# Patient Record
Sex: Male | Born: 2005
Health system: Southern US, Community
[De-identification: ages and names within clinical notes are randomized; demographics above are authoritative.]

## PROBLEM LIST (undated history)

## (undated) ENCOUNTER — Ambulatory Visit

## (undated) DIAGNOSIS — J45909 Unspecified asthma, uncomplicated: Secondary | ICD-10-CM

## (undated) DIAGNOSIS — T7840XA Allergy, unspecified, initial encounter: Secondary | ICD-10-CM

## (undated) HISTORY — PX: MYRINGOTOMY: SUR874

## (undated) HISTORY — DX: Unspecified asthma, uncomplicated: J45.909

## (undated) HISTORY — DX: Allergy, unspecified, initial encounter: T78.40XA

---
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
## 2023-04-04 ENCOUNTER — Other Ambulatory Visit (HOSPITAL_BASED_OUTPATIENT_CLINIC_OR_DEPARTMENT_OTHER): Payer: Self-pay

## 2023-04-13 ENCOUNTER — Other Ambulatory Visit (HOSPITAL_BASED_OUTPATIENT_CLINIC_OR_DEPARTMENT_OTHER): Payer: Self-pay | Admitting: Nurse Practitioner

## 2023-04-13 ENCOUNTER — Other Ambulatory Visit (HOSPITAL_COMMUNITY): Payer: Self-pay

## 2023-04-13 DIAGNOSIS — Z0000 Encounter for general adult medical examination without abnormal findings: Secondary | ICD-10-CM

## 2023-04-13 DIAGNOSIS — F411 Generalized anxiety disorder: Secondary | ICD-10-CM

## 2023-04-13 DIAGNOSIS — F321 Major depressive disorder, single episode, moderate: Secondary | ICD-10-CM

## 2023-04-13 MED ORDER — SERTRALINE HCL 50 MG PO TABS
50.0000 mg | ORAL_TABLET | Freq: Every day | ORAL | 3 refills | Status: DC
  Filled 2023-04-13: qty 90, 90d supply, fill #0

## 2023-04-13 NOTE — Telephone Encounter (Signed)
 Last apt 11/07/21 pt. Sent MyChart message that he needs to schedule an appt.

## 2023-04-23 ENCOUNTER — Other Ambulatory Visit (HOSPITAL_COMMUNITY): Payer: Self-pay

## 2023-05-27 ENCOUNTER — Ambulatory Visit
Admission: EM | Admit: 2023-05-27 | Discharge: 2023-05-27 | Disposition: A | Discharge status: Home / Self Care | Attending: Nurse Practitioner | Admitting: Nurse Practitioner

## 2023-05-27 DIAGNOSIS — Z8709 Personal history of other diseases of the respiratory system: Secondary | ICD-10-CM | POA: Diagnosis not present

## 2023-05-27 DIAGNOSIS — J069 Acute upper respiratory infection, unspecified: Secondary | ICD-10-CM

## 2023-05-27 MED ORDER — ALBUTEROL SULFATE (2.5 MG/3ML) 0.083% IN NEBU: 2.5000 mg | INHALATION_SOLUTION | Freq: Four times a day (QID) | RESPIRATORY_TRACT | 0 refills | Status: DC | PRN

## 2023-05-27 MED ORDER — PREDNISONE 20 MG PO TABS: 40.0000 mg | ORAL_TABLET | Freq: Every day | ORAL | 0 refills | Status: AC

## 2023-05-27 NOTE — ED Provider Notes (Signed)
 [facility]    CSN: [id number] Arrival date & time: 05/27/23  1224      History   Chief Complaint No chief complaint on file.   HPI Andre Diaz is a 18 y.o. male.   The history is provided by the patient.   Patient presents with a 5-day history of cough, chest congestion, wheezing, nasal congestion, and runny nose.  Patient denies fever, chills, headache, ear pain, difficulty breathing, chest pain, abdominal pain, nausea, vomiting, diarrhea, or rash.  Patient reports he has been taking over-the-counter cough and cold medications for his symptoms.  Also reports that he has been using his albuterol  inhaler and nebulizer with minimal relief.  Patient took 2 home COVID test, both were negative.  Patient with underlying history of seasonal allergies and asthma.  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   Reactive airway disease     Patient Active Problem List   Diagnosis Date Noted   Depression, major, single episode, moderate (HCC) 10/21/2021   GAD (generalized anxiety disorder) 10/21/2021   BMI, pediatric, 99th percentile or greater for age 42/16/2023   Allergy to honey bee venom 10/18/2020   Need for immunization against influenza 10/18/2020   Encounter for annual physical exam 10/30/2019   Migraine headache 07/26/2016   ADHD (attention deficit hyperactivity disorder) 08/06/2012    Past Surgical History:  Procedure Laterality Date   MYRINGOTOMY     bilateral   TYMPANOSTOMY TUBE PLACEMENT  2006       Home Medications    Prior to Admission medications   Medication Sig Start Date End Date Taking? Authorizing Provider  benzonatate  (TESSALON  PERLES) 100 MG capsule Take 1 capsule (100 mg total) by mouth 3 (three) times daily as  needed. 01/07/22   Early, Sara E, NP  cephALEXin  (KEFLEX ) 500 MG capsule Take 1 capsule (500 mg total) by mouth 2 (two) times daily. 12/06/22   Corbin Dess, PA-C  cetirizine  (ZYRTEC  ALLERGY) 10 MG tablet Take 1 tablet (10 mg total) by mouth at bedtime. 08/22/22 09/21/22  Mardene Shake, FNP  nystatin  (MYCOSTATIN /NYSTOP ) powder Apply 1 Application topically 2 (two) times daily as needed. 12/06/22   Corbin Dess, PA-C  nystatin  cream (MYCOSTATIN ) Apply to affected area 2 times daily prn 12/06/22   Corbin Dess, PA-C  predniSONE  (DELTASONE ) 10 MG tablet Take 4 tablets (40mg ) on days 1-4, then 3 tablets (30mg ) on days 5-8, then 2 tablets (20mg ) on days 9-11, then 1 tablet daily for days 12-14. Take with food. 08/22/22   Mardene Shake, FNP  sertraline  (ZOLOFT ) 50 MG tablet Take 1 tablet (50 mg total) by mouth at bedtime. 04/13/23   Early, Adriane Albe, NP    Family History Family History  Problem Relation Age of Onset   Diabetes Mother    Kidney disease Father        stones    Social History Social History   Tobacco Use   Smoking status: Never   Smokeless tobacco: Never  Vaping Use   Vaping status: Never Used  Substance Use Topics   Alcohol use: No   Drug use: No     Allergies   Bee venom   Review of Systems Review of Systems Per HPI  Physical Exam Triage Vital Signs ED Triage Vitals  Encounter Vitals Group     BP 05/27/23 1227 119/80     Systolic BP Percentile --      Diastolic BP Percentile --      Pulse Rate 05/27/23 1227 84     Resp 05/27/23 1227 20     Temp 05/27/23 1227 98.5 F (36.9 C)     Temp Source 05/27/23 1227 Oral     SpO2 05/27/23 1227 95 %     Weight --      Height --      Head Circumference --      Peak Flow --      Pain Score 05/27/23 1230 0     Pain Loc --      Pain Education --      Exclude from Growth Chart --    No data found.  Updated Vital Signs BP 119/80 (BP Location: Right Arm)   Pulse 84   Temp 98.5 F (36.9 C)  (Oral)   Resp 20   SpO2 95%   Visual Acuity Right Eye Distance:   Left Eye Distance:   Bilateral Distance:    Right Eye Near:   Left Eye Near:    Bilateral Near:     Physical Exam Vitals and nursing note reviewed.  Constitutional:      General: He is not in acute distress.    Appearance: Normal appearance.  HENT:     Head: Normocephalic.     Right Ear: Tympanic membrane, ear canal and external ear normal.     Left Ear: Tympanic membrane, ear canal and external ear normal.     Nose: Congestion present.     Mouth/Throat:     Mouth: Mucous membranes are moist.     Pharynx: No posterior oropharyngeal erythema.  Eyes:     Extraocular Movements: Extraocular movements intact.     Pupils: Pupils are equal, round, and reactive to light.  Cardiovascular:     Rate and Rhythm: Normal rate and regular rhythm.     Pulses: Normal pulses.     Heart sounds: Normal heart sounds.  Pulmonary:     Effort: Pulmonary effort is normal. No respiratory distress.     Breath sounds: Normal breath sounds. No stridor. No wheezing, rhonchi or rales.  Abdominal:     General: Bowel sounds are normal.     Palpations: Abdomen is soft.     Tenderness: There is no abdominal tenderness.  Musculoskeletal:     Cervical back: Normal range of motion.  Skin:    General: Skin is warm and dry.  Neurological:     General: No focal deficit present.     Mental Status: He is alert and oriented to person, place, and time.  Psychiatric:        Mood and Affect: Mood normal.        Behavior: Behavior normal.      UC Treatments / Results  Labs (all labs ordered are listed, but only abnormal results are displayed) Labs Reviewed - No data to display  EKG   Radiology No results found.  Procedures Procedures (including critical care time)  Medications Ordered in UC Medications - No data to display  Initial Impression / Assessment and Plan / UC Course  I have reviewed the triage vital signs and the  nursing notes.  Pertinent labs & imaging results that were available during my care of the patient were reviewed by me and considered  in my medical decision making (see chart for details).  On exam, lung sounds are clear throughout, room air sats at 95%.  Patient is well-appearing, and vital signs are stable.  Patient with underlying history of asthma, which is most likely being exacerbated by the viral URI.  Will provide symptomatic treatment with prednisone  40 mg for the next 5 days, and refill the patient's albuterol  nebulizer treatment.  Supportive care recommendations were provided and discussed with the patient to include fluids, rest, over-the-counter analgesics, and use of a humidifier during sleep.  Discussed indications with patient regarding follow-up.  Patient was in agreement with this plan of care and verbalizes understanding.  All questions were answered.  Patient stable for discharge. Final Clinical Impressions(s) / UC Diagnoses   Final diagnoses:  None   Discharge Instructions   None    ED Prescriptions   None    PDMP not reviewed this encounter.   Hardy Lia, NP 05/27/23 1314

## 2023-05-27 NOTE — ED Triage Notes (Addendum)
 Pt reports cough,congestion, wheezing, difficulty breathing, has tried Mucinex sx's x 5 days took 2 home covid test both negative

## 2023-05-27 NOTE — Discharge Instructions (Signed)
 Take medication as prescribed. Increase fluids allow for plenty of rest. You may take over-the-counter Tylenol or ibuprofen  as needed for pain, fever, or general discomfort. Recommend the use of a humidifier in your bedroom at nighttime during sleep and sleeping elevated on pillows while cough symptoms persist. Symptoms should improve over the next 5 to 7 days.  If symptoms fail to improve, or appear to be worsening, you may follow-up in this clinic or with your primary care physician for further evaluation. Follow-up as needed.

## 2023-08-30 ENCOUNTER — Ambulatory Visit
Admission: EM | Admit: 2023-08-30 | Discharge: 2023-08-30 | Disposition: A | Discharge status: Home / Self Care | Attending: Family Medicine | Admitting: Family Medicine

## 2023-08-30 DIAGNOSIS — R21 Rash and other nonspecific skin eruption: Secondary | ICD-10-CM | POA: Diagnosis not present

## 2023-08-30 DIAGNOSIS — T63441A Toxic effect of venom of bees, accidental (unintentional), initial encounter: Secondary | ICD-10-CM

## 2023-08-30 DIAGNOSIS — L03114 Cellulitis of left upper limb: Secondary | ICD-10-CM | POA: Diagnosis not present

## 2023-08-30 MED ORDER — MUPIROCIN 2 % EX OINT: 1.0000 | TOPICAL_OINTMENT | Freq: Two times a day (BID) | CUTANEOUS | 0 refills | Status: DC

## 2023-08-30 MED ORDER — BACITRACIN 500 UNIT/GM EX OINT
1.0000 | TOPICAL_OINTMENT | Freq: Two times a day (BID) | CUTANEOUS | Status: DC
  Administered 2023-08-30: 1 via TOPICAL

## 2023-08-30 MED ORDER — METHYLPREDNISOLONE ACETATE 80 MG/ML IJ SUSP
80.0000 mg | Freq: Once | INTRAMUSCULAR | Status: AC
  Administered 2023-08-30: 80 mg via INTRAMUSCULAR

## 2023-08-30 MED ORDER — CEPHALEXIN 500 MG PO CAPS: 500.0000 mg | ORAL_CAPSULE | Freq: Two times a day (BID) | ORAL | 0 refills | Status: DC

## 2023-08-30 MED ORDER — EPINEPHRINE 0.3 MG/0.3ML IJ SOAJ
0.3000 mg | INTRAMUSCULAR | 0 refills | Status: AC | PRN
Start: 2023-08-30 — End: ?

## 2023-08-30 NOTE — ED Notes (Signed)
 Left upper extremity cleaned with hibiclense and sterile water. Pt tolerated well.  Bacitracin  ointment applied to site. Nonadherent pads placed to upper arm and AC and dressing secured with coban. Two smaller areas of redness noted to left forearm. Pt/pt family requested to have cleansed,ointment and bandaid applied to site.pt tolerated well.  Site management and infection prevention reviewed. Pt and pt family verbalized understanding.

## 2023-08-30 NOTE — ED Triage Notes (Addendum)
 Pt reports rash that appeared on his arm after being stung happened 2 days ago hives continue to appear. Started on the left arm where pt was stung and has spread

## 2023-08-30 NOTE — Discharge Instructions (Addendum)
 We have given you a steroid shot today to help resolve the rash and lingering allergic reaction.  You may also take a Zyrtec  and Pepcid once to twice daily to help with the allergic reaction.  I have prescribed an antibiotic to help with the developing cellulitis to your left arm and you may clean the area with soap and water at least once a day and apply the mupirocin  ointment.  Keep it covered with a nonstick dressing.  Follow-up for worsening or unresolving symptoms. As requested, I have prescribed an EpiPen .  If you ever do have to use this, it is imperative that you directly get yourself to the emergency department by either calling 911 or having somebody drive you immediately there.  You must be monitored after taking this medication

## 2023-09-02 NOTE — ED Provider Notes (Signed)
 [facility]    CSN: [id number] Arrival date & time: 08/30/23  1348      History   Chief Complaint No chief complaint on file.   HPI Andre Diaz is a 18 y.o. male.   Patient presenting today with 2-day history of a sting to the left arm and now diffuse redness, swelling, itching, irritation.  He is now having yellow crusting and worsening redness and inflammation to the anterior left elbow region.  Denies fever, chills, chest tightness, shortness of breath, throat itching or swelling though states he did have a bit of that at the initial time of the sting.  No past history of anaphylaxis but does have a past history of moderate allergy to bee stings.  Took 2 Benadryl daily since incident which he states has been giving some mild relief.    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   Reactive airway disease     Patient Active Problem List   Diagnosis Date Noted   Depression, major, single episode, moderate (HCC) 10/21/2021   GAD (generalized anxiety disorder) 10/21/2021   BMI, pediatric, 99th percentile or greater for age 58/16/2023   Allergy to honey bee venom 10/18/2020   Need for immunization against influenza 10/18/2020   Encounter for annual physical exam 10/30/2019   Migraine headache 07/26/2016   ADHD (attention deficit hyperactivity disorder) 08/06/2012    Past Surgical History:  Procedure Laterality Date   MYRINGOTOMY     bilateral   TYMPANOSTOMY TUBE PLACEMENT  2006       Home Medications    Prior to Admission medications   Medication Sig Start Date End Date Taking? Authorizing Provider  cephALEXin  (KEFLEX ) 500 MG capsule Take 1 capsule (500 mg total) by mouth 2 (two) times daily. 08/30/23  Yes Stuart Vernell Norris,  PA-C  EPINEPHrine  0.3 mg/0.3 mL IJ SOAJ injection Inject 0.3 mg into the muscle as needed for anaphylaxis. 08/30/23  Yes Stuart Vernell Norris, PA-C  mupirocin  ointment (BACTROBAN ) 2 % Apply 1 Application topically 2 (two) times daily. 08/30/23  Yes Stuart Vernell Norris, PA-C  albuterol  (PROVENTIL ) (2.5 MG/3ML) 0.083% nebulizer solution Take 3 mLs (2.5 mg total) by nebulization every 6 (six) hours as needed for wheezing or shortness of breath. 05/27/23   Leath-Warren, Etta PARAS, NP  benzonatate  (TESSALON  PERLES) 100 MG capsule Take 1 capsule (100 mg total) by mouth 3 (three) times daily as needed. 01/07/22   Early, Sara E, NP  cephALEXin  (KEFLEX ) 500 MG capsule Take 1 capsule (500 mg total) by mouth 2 (two) times daily. 12/06/22   Stuart Vernell Norris, PA-C  cetirizine  (ZYRTEC  ALLERGY) 10 MG tablet Take 1 tablet (10 mg total) by mouth at bedtime. 08/22/22 09/21/22  Kennyth Domino, FNP  nystatin  (MYCOSTATIN Velma ) powder Apply 1 Application topically 2 (two) times daily as needed. 12/06/22   Stuart Vernell Norris, PA-C  nystatin  cream (MYCOSTATIN ) Apply to affected area 2 times daily prn 12/06/22   Stuart Vernell Norris, PA-C  sertraline  (ZOLOFT ) 50 MG tablet Take 1 tablet (50 mg total) by mouth at bedtime. 04/13/23   EarlyCamie BRAVO, NP    Family History Family History  Problem Relation Age of Onset   Diabetes Mother    Kidney disease Father  stones    Social History Social History   Tobacco Use   Smoking status: Never   Smokeless tobacco: Never  Vaping Use   Vaping status: Never Used  Substance Use Topics   Alcohol use: No   Drug use: No     Allergies   Bee venom   Review of Systems Review of Systems Per HPI  Physical Exam Triage Vital Signs ED Triage Vitals  Encounter Vitals Group     BP 08/30/23 1454 114/73     Girls Systolic BP Percentile --      Girls Diastolic BP Percentile --      Boys Systolic BP Percentile --      Boys Diastolic BP Percentile --       Pulse Rate 08/30/23 1454 65     Resp 08/30/23 1454 20     Temp 08/30/23 1454 98.3 F (36.8 C)     Temp Source 08/30/23 1454 Oral     SpO2 08/30/23 1454 97 %     Weight --      Height --      Head Circumference --      Peak Flow --      Pain Score 08/30/23 1455 9     Pain Loc --      Pain Education --      Exclude from Growth Chart --    No data found.  Updated Vital Signs BP 114/73 (BP Location: Right Arm)   Pulse 65   Temp 98.3 F (36.8 C) (Oral)   Resp 20   SpO2 97%   Visual Acuity Right Eye Distance:   Left Eye Distance:   Bilateral Distance:    Right Eye Near:   Left Eye Near:    Bilateral Near:     Physical Exam Vitals and nursing note reviewed.  Constitutional:      Appearance: Normal appearance.  HENT:     Head: Atraumatic.     Mouth/Throat:     Mouth: Mucous membranes are moist.     Pharynx: Oropharynx is clear. No posterior oropharyngeal erythema.  Eyes:     Extraocular Movements: Extraocular movements intact.     Conjunctiva/sclera: Conjunctivae normal.  Cardiovascular:     Rate and Rhythm: Normal rate and regular rhythm.  Pulmonary:     Effort: Pulmonary effort is normal.     Breath sounds: Normal breath sounds. No wheezing or rales.  Musculoskeletal:        General: Normal range of motion.     Cervical back: Normal range of motion and neck supple.  Skin:    General: Skin is warm.     Comments: Multiple medicine edematous patches to the left upper extremity, with the left AC region being the most prominent.  Yellow honey crust diffusely across this area  Neurological:     General: No focal deficit present.     Mental Status: He is oriented to person, place, and time.  Psychiatric:        Mood and Affect: Mood normal.        Thought Content: Thought content normal.        Judgment: Judgment normal.      UC Treatments / Results  Labs (all labs ordered are listed, but only abnormal results are displayed) Labs Reviewed - No data to  display  EKG   Radiology No results found.  Procedures Procedures (including critical care time)  Medications Ordered in UC Medications  methylPREDNISolone  acetate (DEPO-MEDROL ) injection 80  mg (80 mg Intramuscular Given 08/30/23 1557)    Initial Impression / Assessment and Plan / UC Course  I have reviewed the triage vital signs and the nursing notes.  Pertinent labs & imaging results that were available during my care of the patient were reviewed by me and considered in my medical decision making (see chart for details).     Does sound like he initially had some generalized reaction that was moderately improved after Benadryl so he did not seek further care at that time.  Will treat with Depo-Medrol  for allergic reaction and itch, rash as well as start Keflex  and mupirocin  for some secondary impetigo.  He is requesting an EpiPen  to keep at home, discussed appropriate use and to go to the emergency department if ever having to use it.  Zyrtec , Pepcid twice daily until reaction fully resolves.  Strict return precautions reviewed.  Final Clinical Impressions(s) / UC Diagnoses   Final diagnoses:  Rash  Bee sting, accidental or unintentional, initial encounter  Cellulitis of left upper extremity     Discharge Instructions      We have given you a steroid shot today to help resolve the rash and lingering allergic reaction.  You may also take a Zyrtec  and Pepcid once to twice daily to help with the allergic reaction.  I have prescribed an antibiotic to help with the developing cellulitis to your left arm and you may clean the area with soap and water at least once a day and apply the mupirocin  ointment.  Keep it covered with a nonstick dressing.  Follow-up for worsening or unresolving symptoms. As requested, I have prescribed an EpiPen .  If you ever do have to use this, it is imperative that you directly get yourself to the emergency department by either calling 911 or having somebody  drive you immediately there.  You must be monitored after taking this medication    ED Prescriptions     Medication Sig Dispense Auth. Provider   cephALEXin  (KEFLEX ) 500 MG capsule Take 1 capsule (500 mg total) by mouth 2 (two) times daily. 14 capsule Stuart Vernell Norris, PA-C   mupirocin  ointment (BACTROBAN ) 2 % Apply 1 Application topically 2 (two) times daily. 60 g Stuart Vernell [place], [place]   EPINEPHrine  0.3 mg/0.3 mL IJ SOAJ injection Inject 0.3 mg into the muscle as needed for anaphylaxis. 1 each Stuart Vernell Norris, PA-C      PDMP not reviewed this encounter.   Stuart Vernell Norris, [place] 09/02/23 1039

## 2023-11-04 ENCOUNTER — Other Ambulatory Visit: Payer: Self-pay

## 2023-11-04 NOTE — Progress Notes (Signed)
Pt cleared pre-employment UDS. 

## 2023-11-11 ENCOUNTER — Ambulatory Visit: Payer: Self-pay

## 2023-11-11 ENCOUNTER — Ambulatory Visit: Payer: Self-pay | Admitting: Physician Assistant

## 2023-11-11 ENCOUNTER — Encounter: Payer: Self-pay | Admitting: Physician Assistant

## 2023-11-11 VITALS — BP 133/90 | HR 107 | Temp 98.4°F | Resp 16 | Ht 71.0 in | Wt 270.0 lb

## 2023-11-11 DIAGNOSIS — Z021 Encounter for pre-employment examination: Secondary | ICD-10-CM

## 2023-11-11 NOTE — Progress Notes (Signed)
 PT CLEARED UDS AND COMPLETED ALL REQUIREMENTS FOR PRE-EMPLOYMENT PHYSICAL.

## 2023-11-11 NOTE — Progress Notes (Signed)
 [place] [employer] [place] [facility] ____________________________________________   None    (approximate)  I have reviewed the triage vital signs and the nursing notes.   HISTORY  Chief Complaint Employment Physical   HPI Andre Diaz is a 18 y.o. 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  Patient voiced no concerns or complaints.         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   Reactive airway disease     Patient Active Problem List   Diagnosis Date Noted   Depression, major, single episode, moderate (HCC) 10/21/2021   GAD (generalized anxiety disorder) 10/21/2021   BMI, pediatric, 99th percentile or greater for age 59/16/2023   Allergy to honey bee venom 10/18/2020   Need for immunization against influenza 10/18/2020   Encounter for annual physical exam 10/30/2019   Migraine headache 07/26/2016   ADHD (attention deficit hyperactivity disorder) 08/06/2012    Past Surgical History:  Procedure Laterality Date   MYRINGOTOMY     bilateral   TYMPANOSTOMY TUBE PLACEMENT  2006    Prior to Admission medications   Medication Sig Start Date End Date Taking? Authorizing Provider  EPINEPHrine  0.3 mg/0.3 mL IJ SOAJ injection Inject 0.3 mg into the muscle as needed for anaphylaxis. 08/30/23  Yes Stuart Vernell Norris, PA-C  cetirizine  (ZYRTEC  ALLERGY) 10 MG tablet Take 1 tablet (10 mg total) by mouth at bedtime. 08/22/22 09/21/22  Kennyth Domino, FNP    Allergies Bee venom  Family History  Problem Relation Age of Onset   Diabetes Mother    Kidney disease Father        stones    Social History Social History   Tobacco Use   Smoking status: Never   Smokeless tobacco: Never   Vaping Use   Vaping status: Never Used  Substance Use Topics   Alcohol use: No   Drug use: No    Review of Systems Constitutional: No fever/chills Eyes: No visual changes. ENT: No sore throat. Cardiovascular: Denies chest pain. Respiratory: Denies shortness of breath. Gastrointestinal: No abdominal pain.  No nausea, no vomiting.  No diarrhea.  No constipation. Genitourinary: Negative for dysuria. Musculoskeletal: Negative for back pain. Skin: Negative for rash. Neurological: Negative for headaches, focal weakness or numbness. Psychiatric: Anxiety and depression Allergic/Immunilogical: Bee venom ____________________________________________   PHYSICAL EXAM:  VITAL SIGNS:  11/11/2023   10:13 AM    BP 133/90  Cuff Size Large  Pulse Rate 107  Temp 98.4 F (36.9 C)  Temp Source Temporal  Weight 270 lb (122.5 kg)  Height 5' 11 (1.803 m)  Resp 16  SpO2 97 %   BMI: 37.66 kg/m2  BSA: 2.48 m2   Constitutional: Alert and oriented. Well appearing and in no acute distress. Eyes: Conjunctivae are normal. PERRL. EOMI. Head: Atraumatic. Nose: No congestion/rhinnorhea. Mouth/Throat: Mucous membranes are moist.  Oropharynx non-erythematous. Neck: No stridor. No cervical spine tenderness to palpation. Hematological/Lymphatic/Immunilogical: No cervical lymphadenopathy. Cardiovascular: Tachycardic, regular rhythm. Grossly normal heart sounds.  Good peripheral circulation. Respiratory: Normal respiratory effort.  No retractions. Lungs CTAB. Gastrointestinal: Soft and nontender. No distention. No abdominal bruits. No CVA tenderness. Genitourinary: Deferred Musculoskeletal: No lower extremity tenderness nor edema.  No joint effusions. Neurologic:  Normal speech and language. No gross focal neurologic deficits are appreciated. No gait instability. Skin:  Skin is warm, dry and intact. No rash noted. Psychiatric: Mood and affect are normal. Speech and behavior are  normal.  ____________________________________________   LABS _Pending ___________________________________________  EKG  Sinus rhythm 83 bpm ____________________________________________    ____________________________________________   INITIAL IMPRESSION / ASSESSMENT AND PLAN  As part of my medical decision making, I reviewed the following data within the electronic MEDICAL RECORD NUMBER       No acute findings on physical exam and EKG.  Labs pending.      ____________________________________________   FINAL CLINICAL IMPRESSION Well exam   ED Discharge Orders     None        Note:  This document was prepared using Dragon voice recognition software and may include unintentional dictation errors.

## 2023-11-12 LAB — CMP12+LP+TP+TSH+6AC+CBC/D/PLT
ALT: 42 IU/L (ref 0–44)
AST: 28 IU/L (ref 0–40)
Albumin: 4.8 g/dL (ref 4.3–5.2)
Alkaline Phosphatase: 89 IU/L (ref 51–125)
BUN/Creatinine Ratio: 15 (ref 9–20)
BUN: 13 mg/dL (ref 6–20)
Basophils Absolute: 0 x10E3/uL (ref 0.0–0.2)
Basos: 1 %
Bilirubin Total: 0.9 mg/dL (ref 0.0–1.2)
Calcium: 10.1 mg/dL (ref 8.7–10.2)
Chloride: 103 mmol/L (ref 96–106)
Chol/HDL Ratio: 5.4 ratio — ABNORMAL HIGH (ref 0.0–5.0)
Cholesterol, Total: 195 mg/dL — ABNORMAL HIGH (ref 100–169)
Creatinine, Ser: 0.85 mg/dL (ref 0.76–1.27)
EOS (ABSOLUTE): 0.1 x10E3/uL (ref 0.0–0.4)
Eos: 1 %
Estimated CHD Risk: 1.1 times avg.
Free Thyroxine Index: 2.4 (ref 1.2–4.9)
GGT: 42 IU/L (ref 0–65)
Globulin, Total: 2.7 g/dL (ref 1.5–4.5)
Glucose: 100 mg/dL — ABNORMAL HIGH (ref 70–99)
HDL: 36 mg/dL — ABNORMAL LOW (ref >39)
Hematocrit: 45.3 % (ref 37.5–51.0)
Hemoglobin: 15.7 g/dL (ref 13.0–17.7)
Immature Grans (Abs): 0 x10E3/uL (ref 0.0–0.1)
Immature Granulocytes: 0 %
Iron: 120 ug/dL (ref 38–169)
LDH: 163 IU/L (ref 121–224)
LDL Chol Calc (NIH): 133 mg/dL — ABNORMAL HIGH (ref 0–109)
Lymphocytes Absolute: 1.8 x10E3/uL (ref 0.7–3.1)
Lymphs: 31 %
MCH: 30.1 pg (ref 26.6–33.0)
MCHC: 34.7 g/dL (ref 31.5–35.7)
MCV: 87 fL (ref 79–97)
Monocytes Absolute: 0.4 x10E3/uL (ref 0.1–0.9)
Monocytes: 7 %
Neutrophils Absolute: 3.5 x10E3/uL (ref 1.4–7.0)
Neutrophils: 59 %
Phosphorus: 3.3 mg/dL — ABNORMAL LOW (ref 3.4–5.5)
Platelets: 303 x10E3/uL (ref 150–450)
Potassium: 4.1 mmol/L (ref 3.5–5.2)
RBC: 5.21 x10E6/uL (ref 4.14–5.80)
RDW: 13.1 % (ref 11.6–15.4)
Sodium: 140 mmol/L (ref 134–144)
T3 Uptake Ratio: 30 % (ref 24–39)
T4, Total: 8 ug/dL (ref 4.5–12.0)
TSH: 1.98 u[IU]/mL (ref 0.450–4.500)
Total Protein: 7.5 g/dL (ref 6.0–8.5)
Triglycerides: 143 mg/dL — ABNORMAL HIGH (ref 0–89)
Uric Acid: 7.7 mg/dL (ref 3.8–8.4)
VLDL Cholesterol Cal: 26 mg/dL (ref 5–40)
WBC: 5.9 x10E3/uL (ref 3.4–10.8)
eGFR: 128 mL/min/1.73 (ref >59)

## 2023-11-12 LAB — HEPATITIS B SURFACE ANTIBODY,QUALITATIVE: Hep B Surface Ab, Qual: NONREACTIVE
